# Patient Record
Sex: Female | Born: 1990 | Race: White | Hispanic: No | Marital: Single | Smoking: Never smoker
Health system: Southern US, Community
[De-identification: ages and names within clinical notes are randomized; demographics above are authoritative.]

## PROBLEM LIST (undated history)

## (undated) DIAGNOSIS — F909 Attention-deficit hyperactivity disorder, unspecified type: Secondary | ICD-10-CM

## (undated) DIAGNOSIS — F419 Anxiety disorder, unspecified: Secondary | ICD-10-CM

## (undated) DIAGNOSIS — I10 Essential (primary) hypertension: Secondary | ICD-10-CM

## (undated) HISTORY — PX: CYSTECTOMY: SUR359

---

## 2020-02-05 ENCOUNTER — Ambulatory Visit: Payer: Self-pay | Admitting: Registered Nurse

## 2020-07-04 NOTE — H&P (Signed)
Subjective:     Patient ID: Rita Buchanan is a 30 y.o. female.     Presents for follow up discussion prior to planned bilateral breast mastopexy. Current-does not know bra size. Bothered by pulling sensation breast worse on right lower pole.    Twin pregnancy, delivered 2020.  Had hyperemesis throughout pregnancy. Highest weight 280-290 lb. Continues to lose weight, goal 180 lb.   MMG-one in remote past for right breast pain benign, states pain from infected piercing. PGM with breast ca. States grew up in multiple foster homes and does not know a lot of family history.   Was referred to [facility] for panniculectomy consult; she currently does not meet [facility] criteria for this. Was informed of this both through [place] and [place] clinics. Patient aware of this and continues to lose weight as above. Notes she has problems healing C section wound. Reports near constant rashes beneath panniculus that has failed hygiene measures, weight loss, topical antifungals.   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 Has friend to assist with post op care.   PMH significant for PCOS, anxiety followed by [facility]. States done having kids.   Review of Systems  Musculoskeletal: Positive for back pain and myalgias.  Skin: Positive for rash.  Allergic/Immunologic: Positive for food allergies.  Remainder 12 point review negative      Objective:   Physical Exam Cardiovascular:     Rate and Rhythm: Normal rate.  Pulmonary:     Effort: Pulmonary effort is normal.     Breasts: Grade 3 ptosis bilateral no masses bilateral nipple piercings Sn to nipple R 30 L 30 cm BW R 21 L 21 cm Nipple to IMF R 9 L 10 cm Midline to nipple R 9 L 11 cm      Assessment:     Breast ptosis  Panniculitis    Plan:     Plan bilateral breast mastopexy. Reviewed anchor type scars, OP surgery, possible drains, post operative visits and  limitations, recovery. Diminished sensation nipple and breast skin, risk of nipple loss, wound healing problems, asymmetry, incidental carcinoma, changes with wt gain/loss, aging, unacceptable cosmetic appearance reviewed. Counseled cannot assure her cup size; do not expect any significant change in volume with largely skin resection. Reviewed scar maturation over several months.   Additional risks including but not limited to bleeding seroma hematoma need for additional procedures damage to adjacent structure blood clots in legs or lungs reviewed.   Drain instructions and log given. Patient has pain medication at home.   Patient will remove all piercings prior to surgery. States does not plan to replace nipple piercings.

## 2020-07-05 ENCOUNTER — Encounter (HOSPITAL_BASED_OUTPATIENT_CLINIC_OR_DEPARTMENT_OTHER): Payer: Self-pay | Admitting: Plastic Surgery

## 2020-07-05 ENCOUNTER — Other Ambulatory Visit: Payer: Self-pay

## 2020-07-15 NOTE — Anesthesia Preprocedure Evaluation (Addendum)
Anesthesia Evaluation  Patient identified by MRN, date of birth, ID band Patient awake    Reviewed: Allergy & Precautions, NPO status , Patient's Chart, lab work & pertinent test results  Airway Mallampati: II  TM Distance: >3 FB Neck ROM: Full    Dental no notable dental hx. (+) Teeth Intact, Dental Advisory Given   Pulmonary neg pulmonary ROS,    Pulmonary exam normal breath sounds clear to auscultation       Cardiovascular Exercise Tolerance: Good negative cardio ROS Normal cardiovascular exam Rhythm:Regular Rate:Normal     Neuro/Psych negative neurological ROS     GI/Hepatic negative GI ROS, Neg liver ROS,   Endo/Other  negative endocrine ROS  Renal/GU negative Renal ROS     Musculoskeletal negative musculoskeletal ROS (+)   Abdominal (+) + obese,   Peds  Hematology negative hematology ROS (+)   Anesthesia Other Findings All: peanuts, amoxicillin  Reproductive/Obstetrics negative OB ROS                            Anesthesia Physical Anesthesia Plan  ASA: 2  Anesthesia Plan: General   Post-op Pain Management:    Induction: Intravenous  PONV Risk Score and Plan: 4 or greater and Midazolam, Dexamethasone, Ondansetron, Aprepitant and Treatment may vary due to age or medical condition  Airway Management Planned: Oral ETT  Additional Equipment: None  Intra-op Plan:   Post-operative Plan: Extubation in OR  Informed Consent: I have reviewed the patients History and Physical, chart, labs and discussed the procedure including the risks, benefits and alternatives for the proposed anesthesia with the patient or authorized representative who has indicated his/her understanding and acceptance.     Dental advisory given  Plan Discussed with: CRNA  Anesthesia Plan Comments:        Anesthesia Quick Evaluation

## 2020-07-16 ENCOUNTER — Encounter (HOSPITAL_BASED_OUTPATIENT_CLINIC_OR_DEPARTMENT_OTHER)
Admission: RE | Disposition: A | Discharge status: Home / Self Care | Payer: Self-pay | Source: Home / Self Care | Attending: Plastic Surgery

## 2020-07-16 ENCOUNTER — Encounter (HOSPITAL_BASED_OUTPATIENT_CLINIC_OR_DEPARTMENT_OTHER): Payer: Self-pay | Admitting: Plastic Surgery

## 2020-07-16 ENCOUNTER — Ambulatory Visit (HOSPITAL_BASED_OUTPATIENT_CLINIC_OR_DEPARTMENT_OTHER): Payer: Medicaid Other | Admitting: Anesthesiology

## 2020-07-16 ENCOUNTER — Ambulatory Visit (HOSPITAL_BASED_OUTPATIENT_CLINIC_OR_DEPARTMENT_OTHER)
Admission: RE | Admit: 2020-07-16 | Discharge: 2020-07-16 | Disposition: A | Discharge status: Home / Self Care | Payer: Medicaid Other | Attending: Plastic Surgery | Admitting: Plastic Surgery

## 2020-07-16 ENCOUNTER — Other Ambulatory Visit: Payer: Self-pay

## 2020-07-16 DIAGNOSIS — N6481 Ptosis of breast: Secondary | ICD-10-CM | POA: Insufficient documentation

## 2020-07-16 DIAGNOSIS — Z803 Family history of malignant neoplasm of breast: Secondary | ICD-10-CM | POA: Insufficient documentation

## 2020-07-16 DIAGNOSIS — M793 Panniculitis, unspecified: Secondary | ICD-10-CM | POA: Diagnosis not present

## 2020-07-16 DIAGNOSIS — E282 Polycystic ovarian syndrome: Secondary | ICD-10-CM | POA: Insufficient documentation

## 2020-07-16 HISTORY — DX: Essential (primary) hypertension: I10

## 2020-07-16 HISTORY — DX: Anxiety disorder, unspecified: F41.9

## 2020-07-16 HISTORY — PX: MASTOPEXY: SHX5358

## 2020-07-16 LAB — POCT PREGNANCY, URINE: Preg Test, Ur: NEGATIVE

## 2020-07-16 SURGERY — MASTOPEXY
Anesthesia: General | Site: Breast | Laterality: Bilateral

## 2020-07-16 MED ORDER — CHLORHEXIDINE GLUCONATE CLOTH 2 % EX PADS: 6.0000 | MEDICATED_PAD | Freq: Once | CUTANEOUS | Status: DC

## 2020-07-16 MED ORDER — APREPITANT 40 MG PO CAPS
40.0000 mg | ORAL_CAPSULE | Freq: Once | ORAL | Status: AC
  Administered 2020-07-16: 40 mg via ORAL

## 2020-07-16 MED ORDER — FENTANYL CITRATE (PF) 100 MCG/2ML IJ SOLN
INTRAMUSCULAR | Status: DC | PRN
  Administered 2020-07-16 (×4): 50 ug via INTRAVENOUS
  Administered 2020-07-16: 100 ug via INTRAVENOUS

## 2020-07-16 MED ORDER — DEXAMETHASONE SODIUM PHOSPHATE 10 MG/ML IJ SOLN
INTRAMUSCULAR | Status: AC
  Filled 2020-07-16: qty 1

## 2020-07-16 MED ORDER — SUGAMMADEX SODIUM 200 MG/2ML IV SOLN
INTRAVENOUS | Status: DC | PRN
  Administered 2020-07-16: 200 mg via INTRAVENOUS

## 2020-07-16 MED ORDER — ONDANSETRON HCL 4 MG/2ML IJ SOLN: 4.0000 mg | Freq: Once | INTRAMUSCULAR | Status: DC | PRN

## 2020-07-16 MED ORDER — ROCURONIUM BROMIDE 10 MG/ML (PF) SYRINGE
PREFILLED_SYRINGE | INTRAVENOUS | Status: AC
  Filled 2020-07-16: qty 10

## 2020-07-16 MED ORDER — CELECOXIB 200 MG PO CAPS
ORAL_CAPSULE | ORAL | Status: AC
  Filled 2020-07-16: qty 1

## 2020-07-16 MED ORDER — ACETAMINOPHEN 10 MG/ML IV SOLN: 1000.0000 mg | Freq: Once | INTRAVENOUS | Status: DC | PRN

## 2020-07-16 MED ORDER — LACTATED RINGERS IV SOLN: INTRAVENOUS | Status: DC

## 2020-07-16 MED ORDER — MIDAZOLAM HCL 2 MG/2ML IJ SOLN
INTRAMUSCULAR | Status: DC | PRN
  Administered 2020-07-16: 2 mg via INTRAVENOUS

## 2020-07-16 MED ORDER — FENTANYL CITRATE (PF) 100 MCG/2ML IJ SOLN
INTRAMUSCULAR | Status: AC
  Filled 2020-07-16: qty 2

## 2020-07-16 MED ORDER — 0.9 % SODIUM CHLORIDE (POUR BTL) OPTIME
TOPICAL | Status: DC | PRN
  Administered 2020-07-16: 300 mL

## 2020-07-16 MED ORDER — PROPOFOL 10 MG/ML IV BOLUS
INTRAVENOUS | Status: DC | PRN
  Administered 2020-07-16: 200 mg via INTRAVENOUS

## 2020-07-16 MED ORDER — BUPIVACAINE HCL (PF) 0.5 % IJ SOLN
INTRAMUSCULAR | Status: DC | PRN
  Administered 2020-07-16: 30 mL

## 2020-07-16 MED ORDER — DEXMEDETOMIDINE (PRECEDEX) IN NS 20 MCG/5ML (4 MCG/ML) IV SYRINGE
PREFILLED_SYRINGE | INTRAVENOUS | Status: AC
  Filled 2020-07-16: qty 5

## 2020-07-16 MED ORDER — OXYCODONE HCL 5 MG/5ML PO SOLN: 5.0000 mg | Freq: Once | ORAL | Status: AC | PRN

## 2020-07-16 MED ORDER — GABAPENTIN 300 MG PO CAPS
300.0000 mg | ORAL_CAPSULE | ORAL | Status: AC
  Administered 2020-07-16: 300 mg via ORAL

## 2020-07-16 MED ORDER — DEXAMETHASONE SODIUM PHOSPHATE 10 MG/ML IJ SOLN
INTRAMUSCULAR | Status: DC | PRN
  Administered 2020-07-16: 10 mg via INTRAVENOUS

## 2020-07-16 MED ORDER — AMISULPRIDE (ANTIEMETIC) 5 MG/2ML IV SOLN: 10.0000 mg | Freq: Once | INTRAVENOUS | Status: DC | PRN

## 2020-07-16 MED ORDER — GABAPENTIN 300 MG PO CAPS
ORAL_CAPSULE | ORAL | Status: AC
  Filled 2020-07-16: qty 1

## 2020-07-16 MED ORDER — ONDANSETRON HCL 4 MG/2ML IJ SOLN
INTRAMUSCULAR | Status: DC | PRN
  Administered 2020-07-16: 4 mg via INTRAVENOUS

## 2020-07-16 MED ORDER — LIDOCAINE 2% (20 MG/ML) 5 ML SYRINGE
INTRAMUSCULAR | Status: DC | PRN
  Administered 2020-07-16: 100 mg via INTRAVENOUS

## 2020-07-16 MED ORDER — APREPITANT 40 MG PO CAPS
ORAL_CAPSULE | ORAL | Status: AC
  Filled 2020-07-16: qty 1

## 2020-07-16 MED ORDER — OXYCODONE HCL 5 MG PO TABS
ORAL_TABLET | ORAL | Status: AC
  Filled 2020-07-16: qty 1

## 2020-07-16 MED ORDER — HYDROMORPHONE HCL 1 MG/ML IJ SOLN: 0.2500 mg | INTRAMUSCULAR | Status: DC | PRN

## 2020-07-16 MED ORDER — CLINDAMYCIN PHOSPHATE 600 MG/50ML IV SOLN
INTRAVENOUS | Status: AC
  Filled 2020-07-16: qty 50

## 2020-07-16 MED ORDER — LIDOCAINE HCL (PF) 2 % IJ SOLN
INTRAMUSCULAR | Status: AC
  Filled 2020-07-16: qty 5

## 2020-07-16 MED ORDER — CLINDAMYCIN PHOSPHATE 600 MG/50ML IV SOLN
600.0000 mg | INTRAVENOUS | Status: AC
  Administered 2020-07-16: 600 mg via INTRAVENOUS

## 2020-07-16 MED ORDER — DEXMEDETOMIDINE (PRECEDEX) IN NS 20 MCG/5ML (4 MCG/ML) IV SYRINGE
PREFILLED_SYRINGE | INTRAVENOUS | Status: DC | PRN
  Administered 2020-07-16: 8 ug via INTRAVENOUS
  Administered 2020-07-16: 4 ug via INTRAVENOUS
  Administered 2020-07-16: 8 ug via INTRAVENOUS

## 2020-07-16 MED ORDER — ONDANSETRON HCL 4 MG/2ML IJ SOLN
INTRAMUSCULAR | Status: AC
  Filled 2020-07-16: qty 2

## 2020-07-16 MED ORDER — MIDAZOLAM HCL 2 MG/2ML IJ SOLN
INTRAMUSCULAR | Status: AC
  Filled 2020-07-16: qty 2

## 2020-07-16 MED ORDER — OXYCODONE HCL 5 MG PO TABS
5.0000 mg | ORAL_TABLET | Freq: Once | ORAL | Status: AC | PRN
  Administered 2020-07-16: 5 mg via ORAL

## 2020-07-16 MED ORDER — ACETAMINOPHEN 500 MG PO TABS
1000.0000 mg | ORAL_TABLET | ORAL | Status: AC
  Administered 2020-07-16: 1000 mg via ORAL

## 2020-07-16 MED ORDER — ROCURONIUM BROMIDE 10 MG/ML (PF) SYRINGE
PREFILLED_SYRINGE | INTRAVENOUS | Status: DC | PRN
  Administered 2020-07-16: 60 mg via INTRAVENOUS
  Administered 2020-07-16 (×2): 20 mg via INTRAVENOUS

## 2020-07-16 MED ORDER — ACETAMINOPHEN 500 MG PO TABS
ORAL_TABLET | ORAL | Status: AC
  Filled 2020-07-16: qty 2

## 2020-07-16 MED ORDER — CELECOXIB 200 MG PO CAPS
200.0000 mg | ORAL_CAPSULE | ORAL | Status: AC
  Administered 2020-07-16: 200 mg via ORAL

## 2020-07-16 SURGICAL SUPPLY — 61 items
ADH SKN CLS APL DERMABOND .7 (GAUZE/BANDAGES/DRESSINGS) ×2
APL PRP STRL LF DISP 70% ISPRP (MISCELLANEOUS) ×2
BAG DECANTER FOR FLEXI CONT (MISCELLANEOUS) ×2 IMPLANT
BINDER BREAST LRG (GAUZE/BANDAGES/DRESSINGS) IMPLANT
BINDER BREAST MEDIUM (GAUZE/BANDAGES/DRESSINGS) IMPLANT
BINDER BREAST XLRG (GAUZE/BANDAGES/DRESSINGS) IMPLANT
BINDER BREAST XXLRG (GAUZE/BANDAGES/DRESSINGS) ×2 IMPLANT
BLADE SURG 10 STRL SS (BLADE) ×6 IMPLANT
BLADE SURG 15 STRL LF DISP TIS (BLADE) IMPLANT
BLADE SURG 15 STRL SS (BLADE)
BNDG GAUZE ELAST 4 BULKY (GAUZE/BANDAGES/DRESSINGS) ×4 IMPLANT
CANISTER SUCT 1200ML W/VALVE (MISCELLANEOUS) ×2 IMPLANT
CHLORAPREP W/TINT 26 (MISCELLANEOUS) ×4 IMPLANT
COVER MAYO STAND STRL (DRAPES) ×2 IMPLANT
DECANTER SPIKE VIAL GLASS SM (MISCELLANEOUS) IMPLANT
DERMABOND ADVANCED (GAUZE/BANDAGES/DRESSINGS) ×2
DERMABOND ADVANCED .7 DNX12 (GAUZE/BANDAGES/DRESSINGS) ×2 IMPLANT
DRAIN CHANNEL 15F RND FF W/TCR (WOUND CARE) ×4 IMPLANT
DRAIN CHANNEL 19F RND (DRAIN) IMPLANT
DRAPE TOP ARMCOVERS (MISCELLANEOUS) ×2 IMPLANT
DRAPE U-SHAPE 76X120 STRL (DRAPES) ×2 IMPLANT
DRAPE UTILITY XL STRL (DRAPES) ×4 IMPLANT
DRSG PAD ABDOMINAL 8X10 ST (GAUZE/BANDAGES/DRESSINGS) ×4 IMPLANT
DRSG TEGADERM 2-3/8X2-3/4 SM (GAUZE/BANDAGES/DRESSINGS) IMPLANT
ELECT BLADE 4.0 EZ CLEAN MEGAD (MISCELLANEOUS)
ELECT COATED BLADE 2.86 ST (ELECTRODE) ×2 IMPLANT
ELECT REM PT RETURN 9FT ADLT (ELECTROSURGICAL) ×2
ELECTRODE BLDE 4.0 EZ CLN MEGD (MISCELLANEOUS) IMPLANT
ELECTRODE REM PT RTRN 9FT ADLT (ELECTROSURGICAL) ×1 IMPLANT
EVACUATOR SILICONE 100CC (DRAIN) ×4 IMPLANT
GAUZE SPONGE 4X4 12PLY STRL (GAUZE/BANDAGES/DRESSINGS) IMPLANT
GAUZE SPONGE 4X4 12PLY STRL LF (GAUZE/BANDAGES/DRESSINGS) IMPLANT
GLOVE SURG HYDRASOFT LTX SZ5.5 (GLOVE) ×2 IMPLANT
GOWN STRL REUS W/ TWL LRG LVL3 (GOWN DISPOSABLE) ×1 IMPLANT
GOWN STRL REUS W/TWL LRG LVL3 (GOWN DISPOSABLE) ×2
MARKER SKIN DUAL TIP RULER LAB (MISCELLANEOUS) IMPLANT
NEEDLE HYPO 25X1 1.5 SAFETY (NEEDLE) ×2 IMPLANT
NS IRRIG 1000ML POUR BTL (IV SOLUTION) ×2 IMPLANT
PACK BASIN DAY SURGERY FS (CUSTOM PROCEDURE TRAY) ×2 IMPLANT
PENCIL SMOKE EVACUATOR (MISCELLANEOUS) ×2 IMPLANT
PIN SAFETY STERILE (MISCELLANEOUS) ×2 IMPLANT
SHEET MEDIUM DRAPE 40X70 STRL (DRAPES) ×4 IMPLANT
SLEEVE SCD COMPRESS KNEE MED (STOCKING) ×2 IMPLANT
SPONGE T-LAP 18X18 ~~LOC~~+RFID (SPONGE) ×6 IMPLANT
STAPLER VISISTAT 35W (STAPLE) ×2 IMPLANT
STRIP CLOSURE SKIN 1/2X4 (GAUZE/BANDAGES/DRESSINGS) IMPLANT
SUT ETHILON 2 0 FS 18 (SUTURE) ×2 IMPLANT
SUT MNCRL AB 4-0 PS2 18 (SUTURE) ×8 IMPLANT
SUT PDS AB 2-0 CT2 27 (SUTURE) ×2 IMPLANT
SUT VIC AB 3-0 PS1 18 (SUTURE) ×12
SUT VIC AB 3-0 PS1 18XBRD (SUTURE) ×6 IMPLANT
SUT VIC AB 3-0 SH 27 (SUTURE)
SUT VIC AB 3-0 SH 27X BRD (SUTURE) IMPLANT
SUT VICRYL 4-0 PS2 18IN ABS (SUTURE) ×4 IMPLANT
SYR BULB IRRIG 60ML STRL (SYRINGE) ×2 IMPLANT
SYR CONTROL 10ML LL (SYRINGE) ×2 IMPLANT
TAPE MEASURE VINYL STERILE (MISCELLANEOUS) IMPLANT
TOWEL GREEN STERILE FF (TOWEL DISPOSABLE) ×2 IMPLANT
TUBE CONNECTING 20X1/4 (TUBING) ×2 IMPLANT
UNDERPAD 30X36 HEAVY ABSORB (UNDERPADS AND DIAPERS) ×4 IMPLANT
YANKAUER SUCT BULB TIP NO VENT (SUCTIONS) ×2 IMPLANT

## 2020-07-16 NOTE — Transfer of Care (Signed)
Immediate Anesthesia Transfer of Care Note  Patient: Bobbye Riggs  Procedure(s) Performed: MASTOPEXY (Bilateral: Breast)  Patient Location: PACU  Anesthesia Type:General  Level of Consciousness: awake, alert  and oriented  Airway & Oxygen Therapy: Patient Spontanous Breathing and Patient connected to face mask oxygen  Post-op Assessment: Report given to RN and Post -op Vital signs reviewed and stable  Post vital signs: Reviewed and stable  Last Vitals:  Vitals Value Taken Time  BP 144/85 07/16/20 1442  Temp    Pulse 89 07/16/20 1445  Resp 14 07/16/20 1445  SpO2 98 % 07/16/20 1445  Vitals shown include unvalidated device data.  Last Pain:  Vitals:   07/16/20 0958  TempSrc: Oral  PainSc: 0-No pain         Complications: No notable events documented.

## 2020-07-16 NOTE — Anesthesia Postprocedure Evaluation (Signed)
Anesthesia Post Note  Patient: Rita Buchanan  Procedure(s) Performed: MASTOPEXY (Bilateral: Breast)     Patient location during evaluation: PACU Anesthesia Type: General Level of consciousness: awake and alert Pain management: pain level controlled Vital Signs Assessment: post-procedure vital signs reviewed and stable Respiratory status: spontaneous breathing, nonlabored ventilation, respiratory function stable and patient connected to nasal cannula oxygen Cardiovascular status: blood pressure returned to baseline and stable Postop Assessment: no apparent nausea or vomiting Anesthetic complications: no   No notable events documented.  Last Vitals:  Vitals:   07/16/20 1500 07/16/20 1515  BP: 138/86 139/82  Pulse: 85 80  Resp: 14 (!) 22  Temp:    SpO2: 97% 94%    Last Pain:  Vitals:   07/16/20 1500  TempSrc:   PainSc: 5                  Trevor Iha

## 2020-07-16 NOTE — Op Note (Signed)
Operative Note   DATE OF OPERATION: 7.12.22  LOCATION: [facility]-outpatient  SURGICAL DIVISION: Plastic Surgery  PREOPERATIVE DIAGNOSES:  Breast ptosis  POSTOPERATIVE DIAGNOSES:  same  PROCEDURE:  Bilateral breast mastopexy  SURGEON: Glenna Fellows MD MBA  ASSISTANT: none  ANESTHESIA:  General.   EBL: 75 ml  COMPLICATIONS: None immediate.   INDICATIONS FOR PROCEDURE:  The patient, Rita Buchanan, is a 30 y.o. female born on 1990/05/06, is here for treatment symptomatic breast ptosis.   FINDINGS: Right breast 77 g Left breast 23 g resection  DESCRIPTION OF PROCEDURE:  The patient was marked standing in the preoperative area to mark sternal notch, chest midline, anterior axillary lines, inframammary folds. The location of new nipple areolar complex was marked at level of on inframammary fold on anterior surface breast by palpation. This was marked symmetric over bilateral breasts. With aid of Wise pattern marker, location of new nipple areolar complex and vertical limbs (6 cm) were marked by displacement of breasts along meridian. The patient was taken to the operating room. SCDs were placed and IV antibiotics were given. The patient's operative site was prepped and draped in a sterile fashion. A time out was performed and all information was confirmed to be correct.    Over left breast, superomedial pedicle marked and nipple areolar complex incised with 45 mm diameter. Pedicle deepithlialized and developed to chest wall. Inferiorly based dermoglandular pedicle preserved for use as auto augmentation. This was advanced superiorly and secured to chest wall with interrupted 2-0 PDS suture. Medial and lateral flaps developed. Breast tailor tacked closed.    I then directed attention to left breast superomedial pedicle marked and nipple areolar complex incised with 45 mm diameter. Pedicle deepithlialized and developed to chest wall. Inferiorly based dermoglandular pedicle preserved  for use as auto augmentation. This was advanced superiorly and secured to chest wall with interrupted 2-0 PDS suture. Medial and lateral flaps developed. Breast tailor tacked closed, and patient brought to upright sitting position and assessed for symmetry. Patent returned to supine position. Breast cavities irrigated and hemostasis obtained. Local anesthetic infiltrated throughout each breast. 15 Fr JP placed in each breast and secured with 2-0 nylon. Closure completed bilateral with 3-0 vicryl to approximate dermis along inframammary fold and vertical limb. NAC inset with 4-0 vicryl in dermis. Skin closure completed with 4-0 monocryl subcuticular throughout. Tissue adhesive applied.   The patient was allowed to wake from anesthesia, extubated and taken to the recovery room in satisfactory condition.   SPECIMENS: right and left breast tissue  DRAINS: 15 Fr JP in right and left breast

## 2020-07-16 NOTE — Interval H&P Note (Signed)
History and Physical Interval Note:  07/16/2020 10:39 AM  Rita Buchanan  has presented today for surgery, with the diagnosis of Breast ptosis.  The various methods of treatment have been discussed with the patient and family. After consideration of risks, benefits and other options for treatment, the patient has consented to  Procedure(s): MASTOPEXY (Bilateral) as a surgical intervention.  The patient's history has been reviewed, patient examined, no change in status, stable for surgery.  I have reviewed the patient's chart and labs.  Questions were answered to the patient's satisfaction.     Irean Hong [name]

## 2020-07-16 NOTE — Anesthesia Procedure Notes (Signed)
Procedure Name: Intubation Date/Time: 07/16/2020 11:19 AM Performed by: Pearson Grippe, CRNA Pre-anesthesia Checklist: Patient identified, Emergency Drugs available, Suction available and Patient being monitored Patient Re-evaluated:Patient Re-evaluated prior to induction Oxygen Delivery Method: Circle system utilized Preoxygenation: Pre-oxygenation with 100% oxygen Induction Type: IV induction Ventilation: Mask ventilation without difficulty Laryngoscope Size: Miller and 2 Grade View: Grade I Tube type: Oral Tube size: 7.0 mm Number of attempts: 1 Airway Equipment and Method: Stylet and Oral airway Placement Confirmation: ETT inserted through vocal cords under direct vision, positive ETCO2 and breath sounds checked- equal and bilateral Secured at: 21 cm Tube secured with: Tape Dental Injury: Teeth and Oropharynx as per pre-operative assessment

## 2020-07-16 NOTE — Discharge Instructions (Signed)

## 2020-07-17 ENCOUNTER — Encounter (HOSPITAL_BASED_OUTPATIENT_CLINIC_OR_DEPARTMENT_OTHER): Payer: Self-pay | Admitting: Plastic Surgery

## 2020-07-18 LAB — SURGICAL PATHOLOGY

## 2020-10-14 ENCOUNTER — Encounter (HOSPITAL_BASED_OUTPATIENT_CLINIC_OR_DEPARTMENT_OTHER): Payer: Self-pay | Admitting: Plastic Surgery

## 2020-10-14 NOTE — Progress Notes (Signed)

## 2020-10-14 NOTE — H&P (Signed)
Subjective:     Patient ID: Rita Buchanan is a 30 y.o. female.     Here for follow up discussion prior to planned panniculectomy. Twin pregnancy, delivered 2020.  Had hyperemesis throughout pregnancy. Highest weight 280-290 lb. Weight down 25 lb over last 3 months. Notes she has problems healing C section wound. Reports near constant rashes beneath panniculus that has failed hygiene measures, weight loss, topical antifungals.   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 Has friend to assist with post op care.   PMH significant for PCOS, anxiety followed by [facility]. States done having kids.   Review of Systems      Objective:   Physical Exam Cardiovascular:     Rate and Rhythm: Normal rate and regular rhythm.     Heart sounds: Normal heart sounds.  Pulmonary:     Effort: Pulmonary effort is normal.     Breath sounds: Normal breath sounds.     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   Reviewed panniculectomy vs abdominoplasty. Reviewed this is not a cosmetic procedure. Reviewed changes with aging, wt gain/loss, will not have as taught skin given significant loss elasticity. Reviewed possible overnight stay, drains, post op limitations. Reviewed scar maturation over months. Counseled in mirror area of scars area soft tissue resection and expected  elevation mons. Counseled will not change contour mons. Reviewed surgery will resect portion of tattoo in area.   Additional risks including but not limited to bleeding seroma hematoma unacceptable cosmetic appearance infection wound healing problems need for additional procedures blood clots in legs or lungs reviewed.    Patient will remove all piercings prior to surgery.   Rx for oxycodone given

## 2020-10-22 ENCOUNTER — Encounter (HOSPITAL_BASED_OUTPATIENT_CLINIC_OR_DEPARTMENT_OTHER): Payer: Self-pay | Admitting: Plastic Surgery

## 2020-10-22 ENCOUNTER — Ambulatory Visit (HOSPITAL_BASED_OUTPATIENT_CLINIC_OR_DEPARTMENT_OTHER)
Admission: RE | Admit: 2020-10-22 | Discharge: 2020-10-22 | Disposition: A | Discharge status: Home / Self Care | Payer: Medicaid Other | Attending: Plastic Surgery | Admitting: Plastic Surgery

## 2020-10-22 ENCOUNTER — Ambulatory Visit (HOSPITAL_BASED_OUTPATIENT_CLINIC_OR_DEPARTMENT_OTHER): Payer: Medicaid Other | Admitting: Certified Registered"

## 2020-10-22 ENCOUNTER — Encounter (HOSPITAL_BASED_OUTPATIENT_CLINIC_OR_DEPARTMENT_OTHER)
Admission: RE | Disposition: A | Discharge status: Home / Self Care | Payer: Self-pay | Source: Home / Self Care | Attending: Plastic Surgery

## 2020-10-22 ENCOUNTER — Other Ambulatory Visit: Payer: Self-pay

## 2020-10-22 DIAGNOSIS — Z91010 Allergy to peanuts: Secondary | ICD-10-CM | POA: Diagnosis not present

## 2020-10-22 DIAGNOSIS — K439 Ventral hernia without obstruction or gangrene: Secondary | ICD-10-CM | POA: Diagnosis not present

## 2020-10-22 DIAGNOSIS — Z881 Allergy status to other antibiotic agents status: Secondary | ICD-10-CM | POA: Diagnosis not present

## 2020-10-22 DIAGNOSIS — Z880 Allergy status to penicillin: Secondary | ICD-10-CM | POA: Diagnosis not present

## 2020-10-22 DIAGNOSIS — I10 Essential (primary) hypertension: Secondary | ICD-10-CM | POA: Insufficient documentation

## 2020-10-22 DIAGNOSIS — M793 Panniculitis, unspecified: Secondary | ICD-10-CM | POA: Insufficient documentation

## 2020-10-22 HISTORY — PX: PANNICULECTOMY: SHX5360

## 2020-10-22 HISTORY — PX: VENTRAL HERNIA REPAIR: SHX424

## 2020-10-22 HISTORY — DX: Attention-deficit hyperactivity disorder, unspecified type: F90.9

## 2020-10-22 LAB — POCT PREGNANCY, URINE: Preg Test, Ur: NEGATIVE

## 2020-10-22 SURGERY — PANNICULECTOMY
Anesthesia: General | Site: Abdomen

## 2020-10-22 MED ORDER — SUGAMMADEX SODIUM 200 MG/2ML IV SOLN
INTRAVENOUS | Status: DC | PRN
  Administered 2020-10-22: 200 mg via INTRAVENOUS

## 2020-10-22 MED ORDER — HEPARIN SODIUM (PORCINE) 5000 UNIT/ML IJ SOLN
INTRAMUSCULAR | Status: AC
  Filled 2020-10-22: qty 1

## 2020-10-22 MED ORDER — OXYCODONE HCL 5 MG PO TABS
5.0000 mg | ORAL_TABLET | Freq: Once | ORAL | Status: AC | PRN
Start: 2020-10-22 — End: 2020-10-22
  Administered 2020-10-22: 5 mg via ORAL

## 2020-10-22 MED ORDER — EPHEDRINE 5 MG/ML INJ
INTRAVENOUS | Status: AC
  Filled 2020-10-22: qty 5

## 2020-10-22 MED ORDER — LIDOCAINE HCL (CARDIAC) PF 100 MG/5ML IV SOSY
PREFILLED_SYRINGE | INTRAVENOUS | Status: DC | PRN
  Administered 2020-10-22: 100 mg via INTRAVENOUS

## 2020-10-22 MED ORDER — ACETAMINOPHEN 500 MG PO TABS
1000.0000 mg | ORAL_TABLET | ORAL | Status: AC
  Administered 2020-10-22: 1000 mg via ORAL

## 2020-10-22 MED ORDER — MIDAZOLAM HCL 5 MG/5ML IJ SOLN
INTRAMUSCULAR | Status: DC | PRN
  Administered 2020-10-22: 2 mg via INTRAVENOUS

## 2020-10-22 MED ORDER — 0.9 % SODIUM CHLORIDE (POUR BTL) OPTIME
TOPICAL | Status: DC | PRN
Start: 2020-10-22 — End: 2020-10-22
  Administered 2020-10-22: 500 mL

## 2020-10-22 MED ORDER — CHLORHEXIDINE GLUCONATE CLOTH 2 % EX PADS: 6.0000 | MEDICATED_PAD | Freq: Once | CUTANEOUS | Status: DC

## 2020-10-22 MED ORDER — ONDANSETRON HCL 4 MG/2ML IJ SOLN
INTRAMUSCULAR | Status: DC | PRN
  Administered 2020-10-22: 4 mg via INTRAVENOUS

## 2020-10-22 MED ORDER — CELECOXIB 200 MG PO CAPS
200.0000 mg | ORAL_CAPSULE | ORAL | Status: AC
  Administered 2020-10-22: 200 mg via ORAL

## 2020-10-22 MED ORDER — OXYCODONE HCL 5 MG PO TABS
ORAL_TABLET | ORAL | Status: AC
  Filled 2020-10-22: qty 1

## 2020-10-22 MED ORDER — ACETAMINOPHEN 160 MG/5ML PO SOLN: 325.0000 mg | ORAL | Status: DC | PRN

## 2020-10-22 MED ORDER — ACETAMINOPHEN 325 MG PO TABS: 325.0000 mg | ORAL_TABLET | ORAL | Status: DC | PRN

## 2020-10-22 MED ORDER — FENTANYL CITRATE (PF) 100 MCG/2ML IJ SOLN
INTRAMUSCULAR | Status: AC
  Filled 2020-10-22: qty 2

## 2020-10-22 MED ORDER — DEXAMETHASONE SODIUM PHOSPHATE 10 MG/ML IJ SOLN
INTRAMUSCULAR | Status: AC
  Filled 2020-10-22: qty 1

## 2020-10-22 MED ORDER — OXYCODONE HCL 5 MG/5ML PO SOLN: 5.0000 mg | Freq: Once | ORAL | Status: AC | PRN

## 2020-10-22 MED ORDER — HEPARIN SODIUM (PORCINE) 5000 UNIT/ML IJ SOLN
5000.0000 [IU] | Freq: Once | INTRAMUSCULAR | Status: AC
  Administered 2020-10-22: 5000 [IU] via SUBCUTANEOUS

## 2020-10-22 MED ORDER — PROPOFOL 10 MG/ML IV BOLUS
INTRAVENOUS | Status: DC | PRN
  Administered 2020-10-22: 200 mg via INTRAVENOUS

## 2020-10-22 MED ORDER — MEPERIDINE HCL 25 MG/ML IJ SOLN: 6.2500 mg | INTRAMUSCULAR | Status: DC | PRN

## 2020-10-22 MED ORDER — ONDANSETRON HCL 4 MG/2ML IJ SOLN: 4.0000 mg | Freq: Once | INTRAMUSCULAR | Status: DC | PRN

## 2020-10-22 MED ORDER — LACTATED RINGERS IV SOLN: INTRAVENOUS | Status: DC

## 2020-10-22 MED ORDER — CLINDAMYCIN PHOSPHATE 900 MG/50ML IV SOLN
INTRAVENOUS | Status: AC
  Filled 2020-10-22: qty 50

## 2020-10-22 MED ORDER — CELECOXIB 200 MG PO CAPS
ORAL_CAPSULE | ORAL | Status: AC
  Filled 2020-10-22: qty 1

## 2020-10-22 MED ORDER — BUPIVACAINE HCL (PF) 0.5 % IJ SOLN
INTRAMUSCULAR | Status: DC | PRN
  Administered 2020-10-22: 20 mL

## 2020-10-22 MED ORDER — GABAPENTIN 300 MG PO CAPS
ORAL_CAPSULE | ORAL | Status: AC
  Filled 2020-10-22: qty 1

## 2020-10-22 MED ORDER — DEXAMETHASONE SODIUM PHOSPHATE 4 MG/ML IJ SOLN
INTRAMUSCULAR | Status: DC | PRN
  Administered 2020-10-22: 10 mg via INTRAVENOUS

## 2020-10-22 MED ORDER — ROCURONIUM BROMIDE 100 MG/10ML IV SOLN
INTRAVENOUS | Status: DC | PRN
  Administered 2020-10-22: 80 mg via INTRAVENOUS

## 2020-10-22 MED ORDER — ROCURONIUM BROMIDE 10 MG/ML (PF) SYRINGE
PREFILLED_SYRINGE | INTRAVENOUS | Status: AC
  Filled 2020-10-22: qty 10

## 2020-10-22 MED ORDER — GABAPENTIN 300 MG PO CAPS
300.0000 mg | ORAL_CAPSULE | ORAL | Status: AC
  Administered 2020-10-22: 300 mg via ORAL

## 2020-10-22 MED ORDER — ACETAMINOPHEN 500 MG PO TABS
ORAL_TABLET | ORAL | Status: AC
  Filled 2020-10-22: qty 2

## 2020-10-22 MED ORDER — FENTANYL CITRATE (PF) 100 MCG/2ML IJ SOLN
INTRAMUSCULAR | Status: DC | PRN
  Administered 2020-10-22: 100 ug via INTRAVENOUS
  Administered 2020-10-22: 50 ug via INTRAVENOUS
  Administered 2020-10-22: 100 ug via INTRAVENOUS
  Administered 2020-10-22: 50 ug via INTRAVENOUS

## 2020-10-22 MED ORDER — CLINDAMYCIN PHOSPHATE 900 MG/50ML IV SOLN
900.0000 mg | INTRAVENOUS | Status: AC
  Administered 2020-10-22: 900 mg via INTRAVENOUS

## 2020-10-22 MED ORDER — ONDANSETRON HCL 4 MG/2ML IJ SOLN
INTRAMUSCULAR | Status: AC
  Filled 2020-10-22: qty 2

## 2020-10-22 MED ORDER — KETOROLAC TROMETHAMINE 30 MG/ML IJ SOLN
30.0000 mg | Freq: Once | INTRAMUSCULAR | Status: DC | PRN
Start: 2020-10-22 — End: 2020-10-22

## 2020-10-22 MED ORDER — PROPOFOL 500 MG/50ML IV EMUL
INTRAVENOUS | Status: AC
  Filled 2020-10-22: qty 50

## 2020-10-22 MED ORDER — FENTANYL CITRATE (PF) 100 MCG/2ML IJ SOLN
25.0000 ug | INTRAMUSCULAR | Status: DC | PRN
  Administered 2020-10-22: 50 ug via INTRAVENOUS

## 2020-10-22 MED ORDER — MIDAZOLAM HCL 2 MG/2ML IJ SOLN
INTRAMUSCULAR | Status: AC
  Filled 2020-10-22: qty 2

## 2020-10-22 MED ORDER — LIDOCAINE 2% (20 MG/ML) 5 ML SYRINGE
INTRAMUSCULAR | Status: AC
  Filled 2020-10-22: qty 5

## 2020-10-22 SURGICAL SUPPLY — 59 items
ADH SKN CLS APL DERMABOND .7 (GAUZE/BANDAGES/DRESSINGS) ×2
APL PRP STRL LF DISP 70% ISPRP (MISCELLANEOUS) ×1
APPLIER CLIP 9.375 MED OPEN (MISCELLANEOUS) ×2
APR CLP MED 9.3 20 MLT OPN (MISCELLANEOUS) ×1
BINDER ABDOMINAL 10 UNV 27-48 (MISCELLANEOUS) IMPLANT
BINDER ABDOMINAL 12 SM 30-45 (SOFTGOODS) IMPLANT
BLADE CLIPPER SURG (BLADE) IMPLANT
BLADE SURG 10 STRL SS (BLADE) ×4 IMPLANT
BLADE SURG 11 STRL SS (BLADE) ×2 IMPLANT
BLADE SURG 15 STRL LF DISP TIS (BLADE) IMPLANT
BLADE SURG 15 STRL SS (BLADE)
CANISTER SUCT 1200ML W/VALVE (MISCELLANEOUS) ×2 IMPLANT
CHLORAPREP W/TINT 26 (MISCELLANEOUS) ×2 IMPLANT
CLIP APPLIE 9.375 MED OPEN (MISCELLANEOUS) ×1 IMPLANT
COVER BACK TABLE 60X90IN (DRAPES) ×2 IMPLANT
COVER MAYO STAND STRL (DRAPES) ×2 IMPLANT
DERMABOND ADVANCED (GAUZE/BANDAGES/DRESSINGS) ×2
DERMABOND ADVANCED .7 DNX12 (GAUZE/BANDAGES/DRESSINGS) ×2 IMPLANT
DRAIN CHANNEL 15F RND FF W/TCR (WOUND CARE) ×4 IMPLANT
DRAIN CHANNEL 19F RND (DRAIN) IMPLANT
DRAPE TOP ARMCOVERS (MISCELLANEOUS) ×2 IMPLANT
DRAPE U-SHAPE 76X120 STRL (DRAPES) ×2 IMPLANT
DRAPE UTILITY XL STRL (DRAPES) ×2 IMPLANT
DRSG PAD ABDOMINAL 8X10 ST (GAUZE/BANDAGES/DRESSINGS) ×4 IMPLANT
ELECT BLADE 4.0 EZ CLEAN MEGAD (MISCELLANEOUS)
ELECT COATED BLADE 2.86 ST (ELECTRODE) IMPLANT
ELECT REM PT RETURN 9FT ADLT (ELECTROSURGICAL) ×2
ELECTRODE BLDE 4.0 EZ CLN MEGD (MISCELLANEOUS) IMPLANT
ELECTRODE REM PT RTRN 9FT ADLT (ELECTROSURGICAL) ×1 IMPLANT
EVACUATOR SILICONE 100CC (DRAIN) ×4 IMPLANT
GAUZE XEROFORM 1X8 LF (GAUZE/BANDAGES/DRESSINGS) IMPLANT
GLOVE SURG HYDRASOFT LTX SZ5.5 (GLOVE) ×6 IMPLANT
GOWN STRL REUS W/ TWL LRG LVL3 (GOWN DISPOSABLE) ×2 IMPLANT
GOWN STRL REUS W/TWL LRG LVL3 (GOWN DISPOSABLE) ×4
NEEDLE HYPO 25X1 1.5 SAFETY (NEEDLE) IMPLANT
NS IRRIG 1000ML POUR BTL (IV SOLUTION) ×2 IMPLANT
PACK BASIN DAY SURGERY FS (CUSTOM PROCEDURE TRAY) ×2 IMPLANT
PENCIL SMOKE EVACUATOR (MISCELLANEOUS) ×2 IMPLANT
PIN SAFETY STERILE (MISCELLANEOUS) ×2 IMPLANT
SHEET MEDIUM DRAPE 40X70 STRL (DRAPES) ×4 IMPLANT
SLEEVE SCD COMPRESS KNEE MED (STOCKING) ×2 IMPLANT
SPONGE T-LAP 18X18 ~~LOC~~+RFID (SPONGE) ×4 IMPLANT
STAPLER VISISTAT 35W (STAPLE) ×2 IMPLANT
SUT ETHILON 2 0 FS 18 (SUTURE) ×4 IMPLANT
SUT MNCRL AB 4-0 PS2 18 (SUTURE) ×6 IMPLANT
SUT NOVA NAB DX-16 0-1 5-0 T12 (SUTURE) ×2 IMPLANT
SUT PDS AB 0 CT 36 (SUTURE) ×8 IMPLANT
SUT PDS AB 2-0 CT2 27 (SUTURE) IMPLANT
SUT PLAIN 5 0 P 3 18 (SUTURE) IMPLANT
SUT VLOC 180 0 24IN GS25 (SUTURE) ×6 IMPLANT
SYR BULB IRRIG 60ML STRL (SYRINGE) ×2 IMPLANT
SYR CONTROL 10ML LL (SYRINGE) IMPLANT
TOWEL GREEN STERILE FF (TOWEL DISPOSABLE) ×2 IMPLANT
TRAY FOL W/BAG SLVR 16FR STRL (SET/KITS/TRAYS/PACK) IMPLANT
TRAY FOLEY W/BAG SLVR 14FR LF (SET/KITS/TRAYS/PACK) IMPLANT
TRAY FOLEY W/BAG SLVR 16FR LF (SET/KITS/TRAYS/PACK)
TUBE CONNECTING 20X1/4 (TUBING) ×2 IMPLANT
UNDERPAD 30X36 HEAVY ABSORB (UNDERPADS AND DIAPERS) ×4 IMPLANT
YANKAUER SUCT BULB TIP NO VENT (SUCTIONS) ×2 IMPLANT

## 2020-10-22 NOTE — Op Note (Signed)
Operative Note   DATE OF OPERATION: 10.18.20  LOCATION: [facility]-outpatient  SURGICAL DIVISION: Plastic Surgery  PREOPERATIVE DIAGNOSES:  Panniculitis  POSTOPERATIVE DIAGNOSES:  1. Panniculitis 2. Ventral hernia  PROCEDURE:  Panniculectomy  SURGEON: Glenna Fellows MD MBA  ASSISTANT: none  ANESTHESIA:  General.   EBL: 50 ml for entire procedure  COMPLICATIONS: None immediate.   INDICATIONS FOR PROCEDURE:  The patient, Rita Buchanan, is a 30 y.o. female born on 04/20/1990, is here for treatment chronic panniculitis that has failed conservative measures.   FINDINGS: Incidental hernia ventral midline noted during course of dissection and repaired by Dr. Luisa Hart. Soft tissue resection panniculus 2260 g  DESCRIPTION OF PROCEDURE:  The patient's operative site was marked with the patient in the preoperative area. The patient was taken to the operating room. SCDs were placed and IV antibiotics were given. SQ heparin administered. The patient's operative site was prepped and draped in a sterile fashion. A time out was performed and all information was confirmed to be correct.     Low transverse abdominal incision approximately 7 cm cephalic to anterior fourchette. This was inferior or caudal to prior scar. Skin flap elevated in sub Scarpa's layer, taking care to leave layer of subfascial fat over abdominal wall fascia. Dissection completed toward umbilicus. Umbilicus sharply incised and scissor dissection completed to free from abdominal skin flap. Additional dissection completed in midline toward xiphoid. During course of this dissection, hernia noted. Intraoperative consultation obtained with Dr. Luisa Hart. Please refer to his dictation for description repair hernia. Following this, wound irrigated and hemostasis obtained. Local anesthetic infiltrated. 15 Fr JP placed in right and left subcutaneous abdomen and secured with 2-0 nylon. Diastasis recti imbricated with 0 V lock suture  over both supra and infraumbilical abdomen. Patient then brought to semi sitting position. Caudal extent skin excision marked by palpation. Area marked excised. Superiorly based U shaped skin flap incised for delivery umbilicus. Low transverse abdominal skin incision closed with 0 PDS in superficial fascia. 0 V lock used to close dermis and 4-0 monocryl for subcuticular skin closure. Umbilicus inset with 4-0 monocryl in dermis and 5-0 plain gut for skin closure. Xeroform bolster placed within umbilicus. Dermabond applied to low transverse incision. Dry dressing and abdominal binder placed.  The patient was allowed to wake from anesthesia, extubated and taken to the recovery room in satisfactory condition.   SPECIMENS: none  DRAINS: 15 Fr JP in right and left abdomen

## 2020-10-22 NOTE — Discharge Instructions (Addendum)
Post Anesthesia Home Care Instructions  Activity: Get plenty of rest for the remainder of the day. A responsible individual must stay with you for 24 hours following the procedure.  For the next 24 hours, DO NOT: -Drive a car -Advertising copywriter -Drink alcoholic beverages -Take any medication unless instructed by your physician -Make any legal decisions or sign important papers.  Meals: Start with liquid foods such as gelatin or soup. Progress to regular foods as tolerated. Avoid greasy, spicy, heavy foods. If nausea and/or vomiting occur, drink only clear liquids until the nausea and/or vomiting subsides. Call your physician if vomiting continues.  Special Instructions/Symptoms: Your throat may feel dry or sore from the anesthesia or the breathing tube placed in your throat during surgery. If this causes discomfort, gargle with warm salt water. The discomfort should disappear within 24 hours.  If you had a scopolamine patch placed behind your ear for the management of post- operative nausea and/or vomiting:  1. The medication in the patch is effective for 72 hours, after which it should be removed.  Wrap patch in a tissue and discard in the trash. Wash hands thoroughly with soap and water. 2. You may remove the patch earlier than 72 hours if you experience unpleasant side effects which may include dry mouth, dizziness or visual disturbances. 3. Avoid touching the patch. Wash your hands with soap and water after contact with the patch.    About my Jackson-Pratt Bulb Drain  What is a Jackson-Pratt bulb? A Jackson-Pratt is a soft, round device used to collect drainage. It is connected to a long, thin drainage catheter, which is held in place by one or two small stiches near your surgical incision site. When the bulb is squeezed, it forms a vacuum, forcing the drainage to empty into the bulb.  Emptying the Jackson-Pratt bulb- To empty the bulb: 1. Release the plug on the top of the  bulb. 2. Pour the bulb's contents into a measuring container which your nurse will provide. 3. Record the time emptied and amount of drainage. Empty the drain(s) as often as your     doctor or nurse recommends.  Date                  Time                    Amount (Drain 1)                 Amount (Drain 2)  _____________________________________________________________________  _____________________________________________________________________  _____________________________________________________________________  _____________________________________________________________________  _____________________________________________________________________  _____________________________________________________________________  _____________________________________________________________________  _____________________________________________________________________  Squeezing the Jackson-Pratt Bulb- To squeeze the bulb: 1. Make sure the plug at the top of the bulb is open. 2. Squeeze the bulb tightly in your fist. You will hear air squeezing from the bulb. 3. Replace the plug while the bulb is squeezed. 4. Use a safety pin to attach the bulb to your clothing. This will keep the catheter from     pulling at the bulb insertion site.  When to call your doctor- Call your doctor if: Drain site becomes red, swollen or hot. You have a fever greater than 101 degrees F. There is oozing at the drain site. Drain falls out (apply a guaze bandage over the drain hole and secure it with tape). Drainage increases daily not related to activity patterns. (You will usually have more drainage when you are active than when you are resting.) Drainage has a bad odor.   No tylenol  until after 1:30pm today, if needed. No ibuprofen/motrin until after 3:30pm today, if needed.

## 2020-10-22 NOTE — Transfer of Care (Signed)
Immediate Anesthesia Transfer of Care Note  Patient: Rita Buchanan  Procedure(s) Performed: PANNICULECTOMY (Abdomen) HERNIA REPAIR VENTRAL ADULT (Abdomen)  Patient Location: PACU  Anesthesia Type:General  Level of Consciousness: awake  Airway & Oxygen Therapy: Patient Spontanous Breathing and Patient connected to face mask oxygen  Post-op Assessment: Report given to RN and Post -op Vital signs reviewed and stable  Post vital signs: Reviewed and stable  Last Vitals:  Vitals Value Taken Time  BP 131/81 10/22/20 1020  Temp    Pulse 66 10/22/20 1021  Resp 26 10/22/20 1021  SpO2 98 % 10/22/20 1021  Vitals shown include unvalidated device data.  Last Pain:  Vitals:   10/22/20 0715  TempSrc: Oral  PainSc: 0-No pain         Complications: No notable events documented.

## 2020-10-22 NOTE — Op Note (Signed)
Preoperative diagnosis: Ventral hernia  Postoperative diagnosis: Same  Procedure: Repair of ventral hernia  Surgeon: Harriette Bouillon MD  Anesthesia: General  EBL: Minimal  Intraoperative findings: Consulted to scrub and during abdominal plasty by Dr Leta Baptist.  She found a small ventral hernia measuring about a centimeter in the upper midline approximately 10 cm from the umbilicus.  I was asked to scrub in to assist and/or fix the fascial wall defect.  I reviewed her chart as well as her medical record and history.  No history of a hernia and this was discovered incidentally when Dr. Leta Baptist dissecting down on the abdominal wall.  She requested assistance in repairing this as part of the abdominoplasty   Description of procedure: I was called to room 6 at the [facility] day hospital during the abdominoplasty.  I was asked for intraoperative consultation.  The chart was reviewed.  I scrubbed in.  I examined the fascial defect and this measured about a centimeter maximal diameter in the mid to upper midline above the umbilicus.  There is a hernia sac that had some preperitoneal fat.  This was reduced through the defect.  There appeared to be no bowel contents within this.  The preperitoneal space was not violated.  I opted to close this with interrupted #1 Novafil.  4 sutures were used to close the 1 cm defect with interrupted bites.  This closed well.  The defect was closed.  This point time I scrubbed out of the procedure to let the plastic surgeon finish the abdominal plasty.  There were no immediate complications.  Blood loss was minimal.  She was hemodynamically stable.

## 2020-10-22 NOTE — Anesthesia Postprocedure Evaluation (Signed)
Anesthesia Post Note  Patient: Rita Buchanan  Procedure(s) Performed: PANNICULECTOMY (Abdomen) HERNIA REPAIR VENTRAL ADULT (Abdomen)     Patient location during evaluation: PACU Anesthesia Type: General Level of consciousness: awake and sedated Pain management: pain level controlled Vital Signs Assessment: post-procedure vital signs reviewed and stable Respiratory status: spontaneous breathing Cardiovascular status: stable Postop Assessment: no apparent nausea or vomiting Anesthetic complications: no   No notable events documented.  Last Vitals:  Vitals:   10/22/20 1115 10/22/20 1130  BP: (!) 155/94 (!) 163/104  Pulse: 69 69  Resp: (!) 25 (!) 23  Temp:    SpO2: 96% 97%    Last Pain:  Vitals:   10/22/20 1115  TempSrc:   PainSc: 5                  John F Scharlene Corn

## 2020-10-22 NOTE — Interval H&P Note (Signed)
History and Physical Interval Note:  10/22/2020 7:01 AM  Rita Buchanan  has presented today for surgery, with the diagnosis of panniculitis.  The various methods of treatment have been discussed with the patient and family. After consideration of risks, benefits and other options for treatment, the patient has consented to  Procedure(s): PANNICULECTOMY (N/A) as a surgical intervention.  The patient's history has been reviewed, patient examined, no change in status, stable for surgery.  I have reviewed the patient's chart and labs.  Questions were answered to the patient's satisfaction.     Irean Hong Rori Goar

## 2020-10-22 NOTE — Anesthesia Preprocedure Evaluation (Signed)
Anesthesia Evaluation  Patient identified by MRN, date of birth, ID band Patient awake    Reviewed: Allergy & Precautions, NPO status , Patient's Chart, lab work & pertinent test results  Airway Mallampati: II  TM Distance: >3 FB Neck ROM: Full    Dental no notable dental hx.    Pulmonary neg pulmonary ROS,    Pulmonary exam normal        Cardiovascular hypertension, Pt. on medications Normal cardiovascular exam     Neuro/Psych PSYCHIATRIC DISORDERS Anxiety negative neurological ROS     GI/Hepatic negative GI ROS, Neg liver ROS,   Endo/Other  negative endocrine ROS  Renal/GU negative Renal ROS  negative genitourinary   Musculoskeletal negative musculoskeletal ROS (+)   Abdominal (+) + obese,   Peds  Hematology   Anesthesia Other Findings   Reproductive/Obstetrics                             Anesthesia Physical Anesthesia Plan  ASA: 2  Anesthesia Plan: General   Post-op Pain Management:    Induction: Intravenous  PONV Risk Score and Plan: 3 and Ondansetron, Dexamethasone and Midazolam  Airway Management Planned: Oral ETT  Additional Equipment: None  Intra-op Plan:   Post-operative Plan: Extubation in OR  Informed Consent: I have reviewed the patients History and Physical, chart, labs and discussed the procedure including the risks, benefits and alternatives for the proposed anesthesia with the patient or authorized representative who has indicated his/her understanding and acceptance.     Dental advisory given  Plan Discussed with:   Anesthesia Plan Comments:         Anesthesia Quick Evaluation

## 2020-10-22 NOTE — Anesthesia Procedure Notes (Signed)
Procedure Name: Intubation Date/Time: 10/22/2020 7:45 AM Performed by: Tawni Millers, CRNA Pre-anesthesia Checklist: Patient identified, Emergency Drugs available, Suction available and Patient being monitored Patient Re-evaluated:Patient Re-evaluated prior to induction Oxygen Delivery Method: Circle system utilized Preoxygenation: Pre-oxygenation with 100% oxygen Induction Type: IV induction Ventilation: Mask ventilation without difficulty Laryngoscope Size: Mac and 3 Grade View: Grade I Tube type: Oral Tube size: 7.0 mm Number of attempts: 1 Airway Equipment and Method: Stylet and Oral airway Placement Confirmation: ETT inserted through vocal cords under direct vision, positive ETCO2 and breath sounds checked- equal and bilateral Tube secured with: Tape Dental Injury: Teeth and Oropharynx as per pre-operative assessment

## 2020-10-24 ENCOUNTER — Encounter (HOSPITAL_BASED_OUTPATIENT_CLINIC_OR_DEPARTMENT_OTHER): Payer: Self-pay | Admitting: Plastic Surgery

## 2021-05-14 ENCOUNTER — Other Ambulatory Visit: Payer: Self-pay | Admitting: Plastic Surgery

## 2021-05-14 DIAGNOSIS — N6310 Unspecified lump in the right breast, unspecified quadrant: Secondary | ICD-10-CM

## 2021-05-29 ENCOUNTER — Ambulatory Visit
Admission: RE | Admit: 2021-05-29 | Discharge: 2021-05-29 | Disposition: A | Discharge status: Home / Self Care | Payer: Medicaid Other | Source: Ambulatory Visit | Attending: Plastic Surgery | Admitting: Plastic Surgery

## 2021-05-29 ENCOUNTER — Ambulatory Visit: Payer: Medicaid Other

## 2021-05-29 DIAGNOSIS — N6310 Unspecified lump in the right breast, unspecified quadrant: Secondary | ICD-10-CM

## 2021-06-19 ENCOUNTER — Other Ambulatory Visit: Payer: Self-pay | Admitting: Plastic Surgery

## 2021-06-19 ENCOUNTER — Ambulatory Visit
Admission: RE | Admit: 2021-06-19 | Discharge: 2021-06-19 | Disposition: A | Discharge status: Home / Self Care | Payer: Medicaid Other | Source: Ambulatory Visit | Attending: Plastic Surgery | Admitting: Plastic Surgery

## 2021-06-19 DIAGNOSIS — N6310 Unspecified lump in the right breast, unspecified quadrant: Secondary | ICD-10-CM

## 2021-09-22 ENCOUNTER — Other Ambulatory Visit: Payer: Medicaid Other

## 2021-10-03 ENCOUNTER — Inpatient Hospital Stay: Admission: RE | Admit: 2021-10-03 | Payer: Medicaid Other | Source: Ambulatory Visit

## 2022-10-29 IMAGING — MG DIGITAL DIAGNOSTIC BILAT W/ TOMO W/ CAD
6 of 12 series · 6 of 36 positions shown · non-contrast
Comparison: None.

CLINICAL DATA: 31-year-old female presenting for baseline
evaluation. Patient has new lumps in the inferior and superior right
breast. Patient had a bilateral breast lift approximately 1 year
ago.

EXAM:
DIGITAL DIAGNOSTIC BILATERAL MAMMOGRAM WITH TOMOSYNTHESIS AND CAD;
ULTRASOUND RIGHT BREAST LIMITED
TECHNIQUE: Bilateral digital diagnostic mammography and breast tomosynthesis
was performed. The images were evaluated with computer-aided
detection.; Targeted ultrasound examination of the right breast was
performed

[L CC synth-2D (1 of 2)]
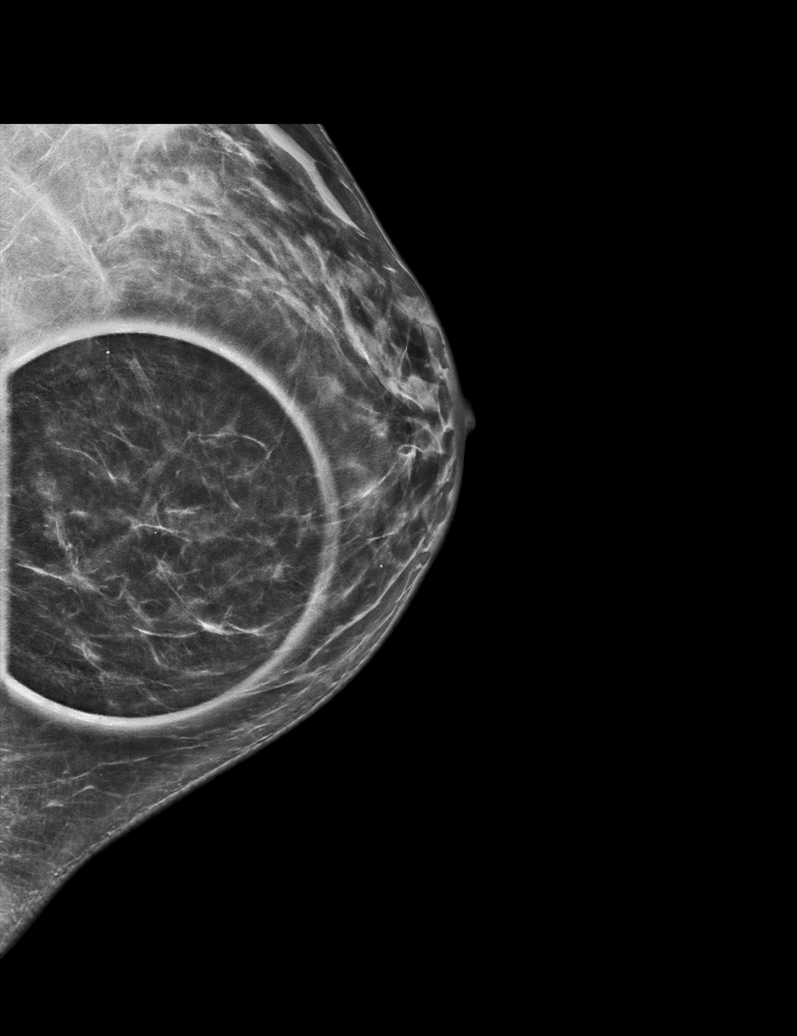

[R TAN synth-2D]
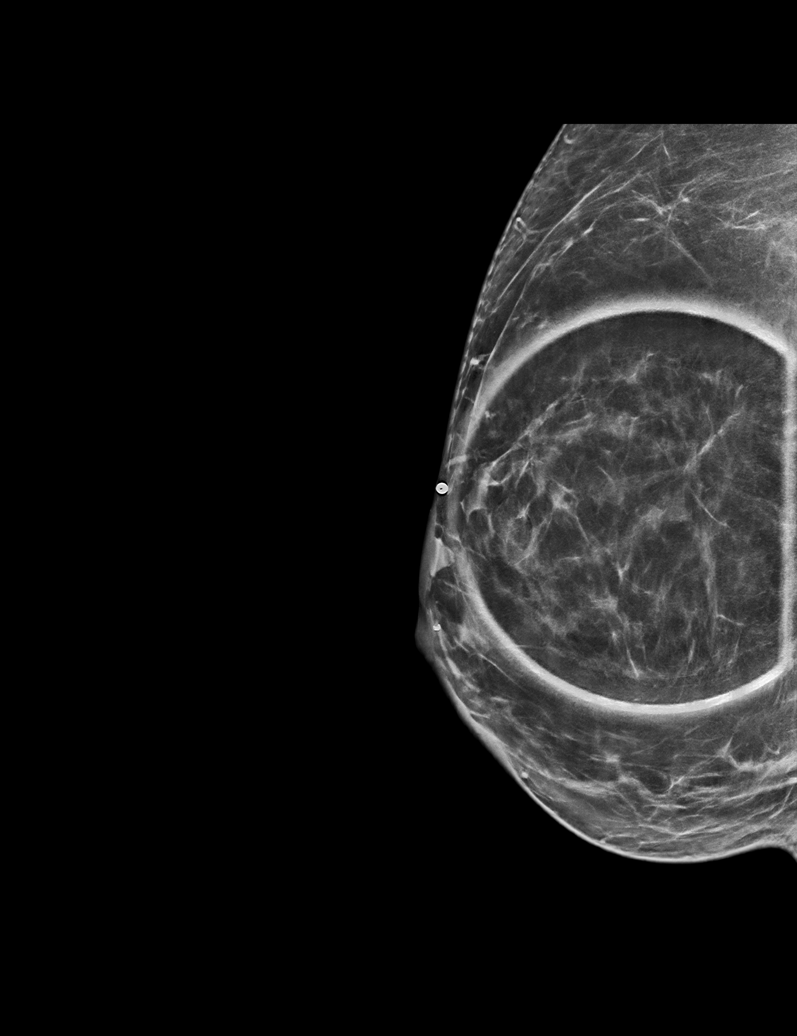

[R CC synth-2D]
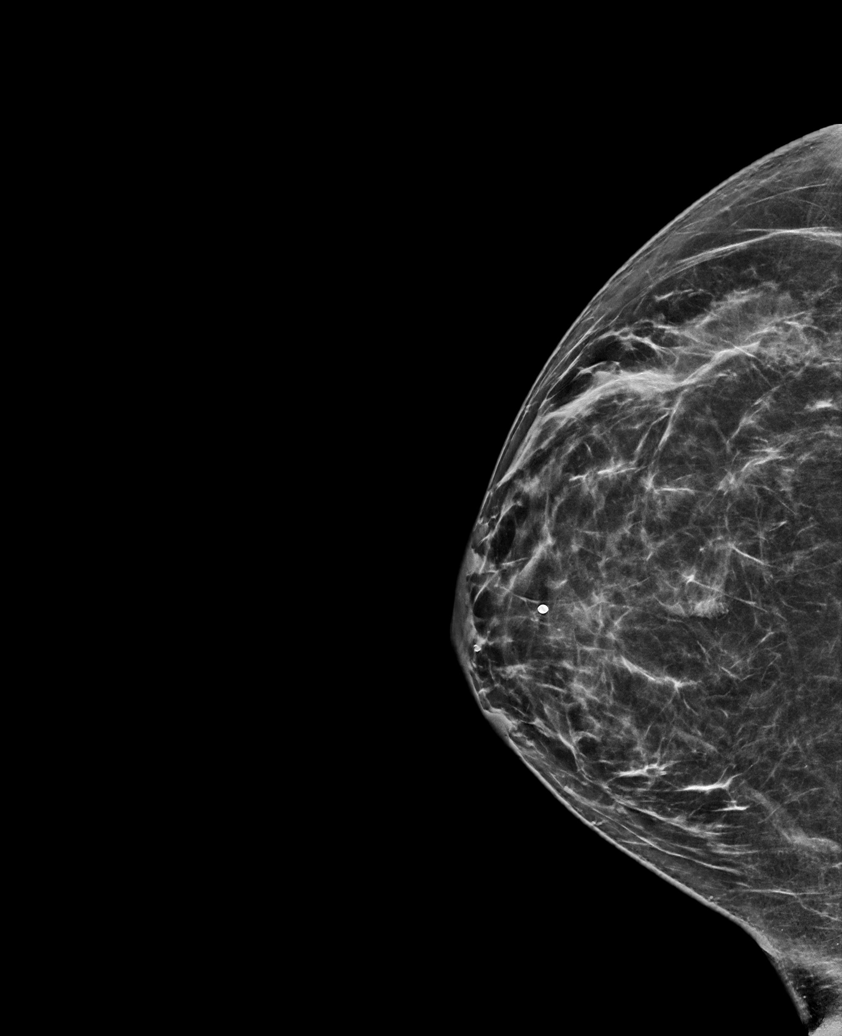

[L MLO synth-2D]
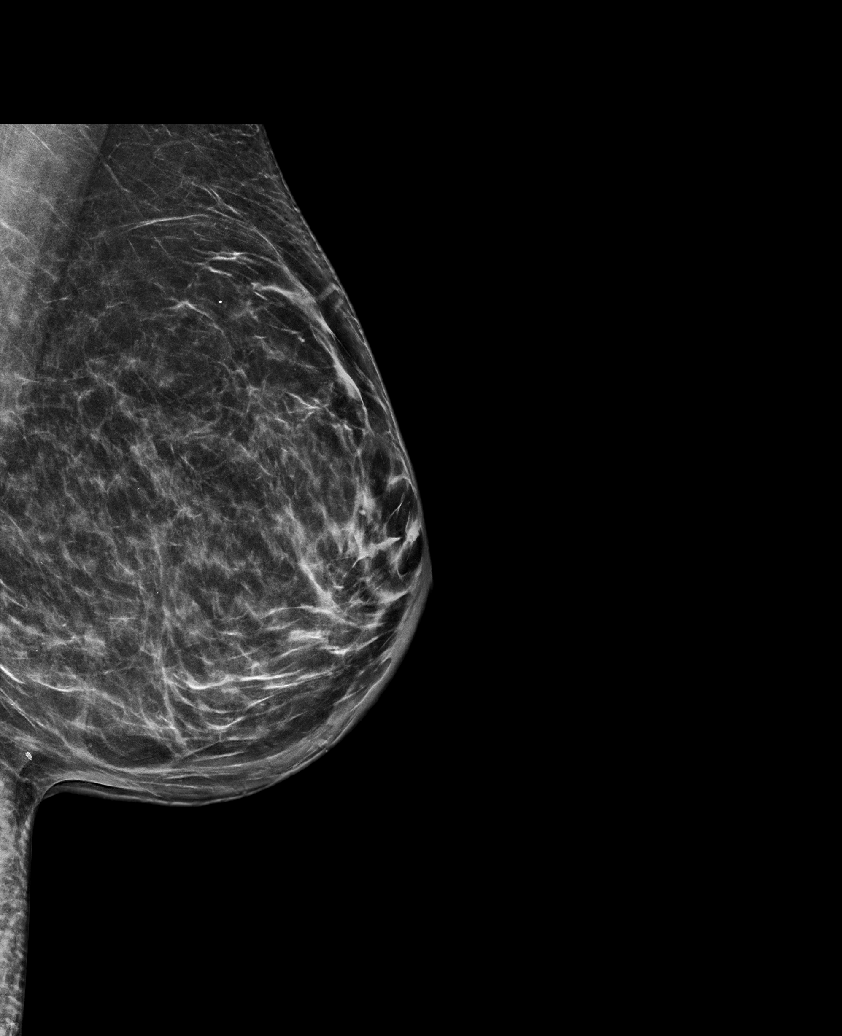

[L CC synth-2D (2 of 2)]
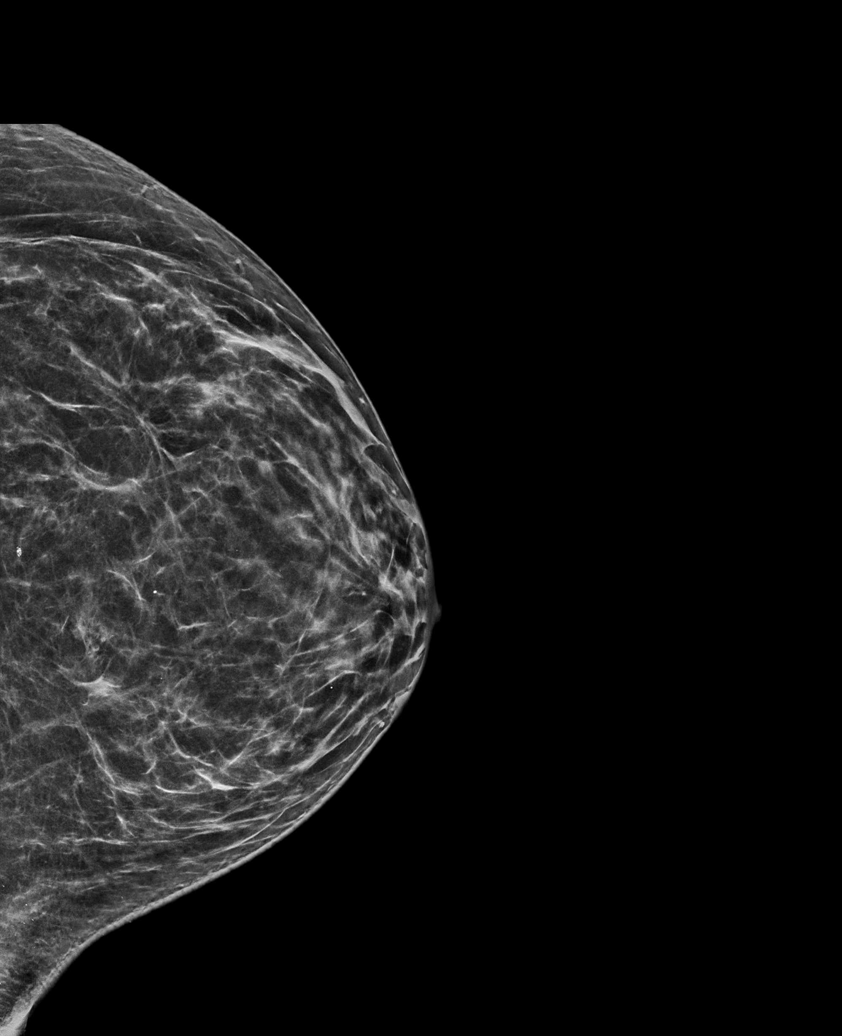

[R MLO synth-2D]
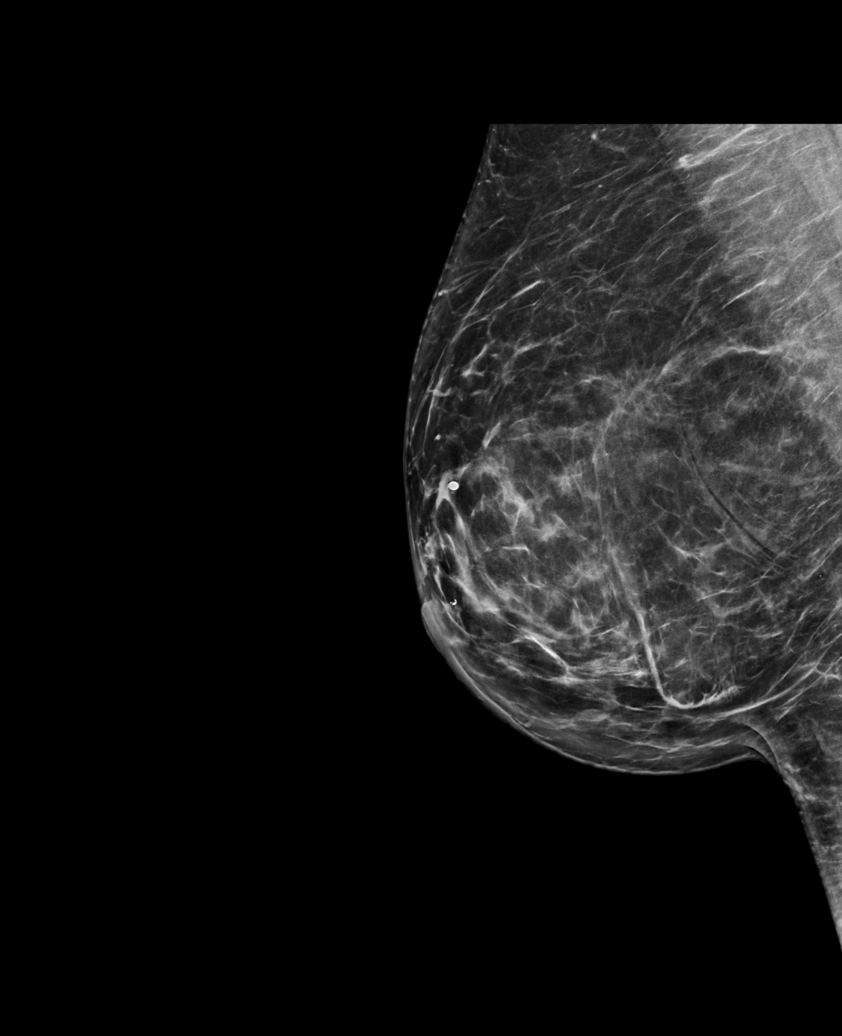

[6 of 36 positions shown; findings below may reference images not displayed]

ACR Breast Density Category b: There are scattered areas of
fibroglandular density.
FINDINGS: Mammogram:

Right breast: A skin BB marks the palpable site of concern reported
by the patient in the central superior right breast. A spot
tangential view of this area was performed in addition to standard
views. There is a small subtle oval mass containing fat measuring
approximately 0.9 cm. Additionally in the inferior right breast
along the patient's surgical scar there is an oval circumscribed fat
containing mass consistent with an oil cyst. No additional findings
elsewhere in the right breast.

Left breast: A spot compression tomosynthesis view was performed in
addition to standard views for a questioned asymmetry in the medial
left breast. On the additional imaging the asymmetry elongates and
is most consistent with postsurgical scarring. There is no
suspicious mass. No additional findings elsewhere in the left
breast.

On physical exam at the sites of concern reported by the patient in
the central inferior and central superior right breast I feel small
masses.

Ultrasound:

Targeted ultrasound is performed at the palpable site of concern
reported by the patient in the right breast at 6 o'clock 6 cm from
the nipple demonstrating a superficial oval circumscribed isoechoic
mass measuring 1.1 x 0.7 x 1.0 cm, consistent with an oil cyst. This
corresponds to the mammographic finding.

At 12 o'clock 2 cm from the nipple there is a small superficial
echogenic area with internal cystic spaces measuring 1.3 x 0.6 x
cm, most likely representing fat necrosis. This corresponds to the
small mass identified mammographically in the superior right breast.
IMPRESSION: 1. Probably benign echogenic/cystic area in the right breast at 12
o'clock, likely fat necrosis.

2. Benign oil cyst in the central inferior right breast along the
patient's surgical scar.

3.  No mammographic evidence of malignancy in the left breast.

RECOMMENDATION:
Right breast ultrasound in 3 months.

I have discussed the findings and recommendations with the patient
who agrees to short-term follow-up. If applicable, a reminder letter
will be sent to the patient regarding the next appointment.

BI-RADS CATEGORY  3: Probably benign.
